# Patient Record
Sex: Male | Born: 1980
Health system: Southern US, Community
[De-identification: ages and names within clinical notes are randomized; demographics above are authoritative.]

## PROBLEM LIST (undated history)

## (undated) DIAGNOSIS — F419 Anxiety disorder, unspecified: Secondary | ICD-10-CM

## (undated) DIAGNOSIS — K900 Celiac disease: Secondary | ICD-10-CM

## (undated) DIAGNOSIS — E119 Type 2 diabetes mellitus without complications: Secondary | ICD-10-CM

## (undated) HISTORY — DX: Type 2 diabetes mellitus without complications: E11.9

## (undated) HISTORY — PX: APPENDECTOMY: SHX54

## (undated) HISTORY — PX: KNEE SURGERY: SHX244

## (undated) HISTORY — DX: Celiac disease: K90.0

## (undated) HISTORY — DX: Anxiety disorder, unspecified: F41.9

---

## 2001-09-23 ENCOUNTER — Emergency Department (HOSPITAL_COMMUNITY): Admission: EM | Admit: 2001-09-23 | Discharge: 2001-09-23 | Payer: Self-pay

## 2002-11-13 ENCOUNTER — Emergency Department (HOSPITAL_COMMUNITY): Admission: EM | Admit: 2002-11-13 | Discharge: 2002-11-13 | Payer: Self-pay | Admitting: Emergency Medicine

## 2005-10-11 ENCOUNTER — Encounter (INDEPENDENT_AMBULATORY_CARE_PROVIDER_SITE_OTHER): Payer: Self-pay | Admitting: Specialist

## 2005-10-11 ENCOUNTER — Observation Stay (HOSPITAL_COMMUNITY): Admission: EM | Admit: 2005-10-11 | Discharge: 2005-10-13 | Payer: Self-pay | Admitting: Emergency Medicine

## 2005-12-13 ENCOUNTER — Encounter: Admission: RE | Admit: 2005-12-13 | Discharge: 2005-12-13 | Payer: Self-pay | Admitting: General Surgery

## 2010-01-31 ENCOUNTER — Encounter: Admission: RE | Admit: 2010-01-31 | Discharge: 2010-01-31 | Payer: Self-pay | Admitting: Family Medicine

## 2010-02-09 ENCOUNTER — Encounter: Admission: RE | Admit: 2010-02-09 | Discharge: 2010-03-23 | Payer: Self-pay | Admitting: Family Medicine

## 2010-04-21 ENCOUNTER — Ambulatory Visit
Admission: RE | Admit: 2010-04-21 | Discharge: 2010-04-21 | Payer: Self-pay | Source: Home / Self Care | Attending: Orthopedic Surgery | Admitting: Orthopedic Surgery

## 2010-06-04 ENCOUNTER — Encounter: Payer: Self-pay | Admitting: Orthopedic Surgery

## 2010-07-26 LAB — POCT HEMOGLOBIN-HEMACUE: Hemoglobin: 15.3 g/dL (ref 13.0–17.0)

## 2010-08-07 ENCOUNTER — Emergency Department (HOSPITAL_BASED_OUTPATIENT_CLINIC_OR_DEPARTMENT_OTHER)
Admission: EM | Admit: 2010-08-07 | Discharge: 2010-08-07 | Disposition: A | Discharge status: Home / Self Care | Payer: PRIVATE HEALTH INSURANCE | Attending: Emergency Medicine | Admitting: Emergency Medicine

## 2010-08-07 DIAGNOSIS — F319 Bipolar disorder, unspecified: Secondary | ICD-10-CM | POA: Insufficient documentation

## 2010-08-07 DIAGNOSIS — B9789 Other viral agents as the cause of diseases classified elsewhere: Secondary | ICD-10-CM | POA: Insufficient documentation

## 2010-08-07 DIAGNOSIS — K589 Irritable bowel syndrome without diarrhea: Secondary | ICD-10-CM | POA: Insufficient documentation

## 2010-08-07 LAB — RAPID STREP SCREEN (MED CTR MEBANE ONLY): Streptococcus, Group A Screen (Direct): NEGATIVE

## 2010-08-07 LAB — MONONUCLEOSIS SCREEN: Mono Screen: NEGATIVE

## 2010-09-30 NOTE — Op Note (Signed)
NAMEAUDY, DAUPHINE NO.:  [id number]   MEDICAL RECORD NO.:  [id number]          PATIENT TYPE:  OBV   LOCATION:  2633                         FACILITY:  [facility]   PHYSICIAN:  Orson Ape. Weatherly, M.D.DATE OF BIRTH:  14-Apr-1981   DATE OF PROCEDURE:  10/11/2005  DATE OF DISCHARGE:                                 OPERATIVE REPORT   PREOPERATIVE DIAGNOSIS:  Acute appendicitis, retrocecal.   POSTOPERATIVE DIAGNOSIS:  Acute appendicitis, retrocecal, high lying cecum.   OPERATION:  Appendectomy.   SURGEON:  Orson Ape. Rise Patience, M.D.   ASSISTANT:  Nurse.   HISTORY:  Prudencio Velazco is a 30 year old male with about a 24 history of  progressive abdominal pain and nausea and vomiting. A CT was done at  [facility] which showed acute appendicitis located in the retrocecal area.  The patient was given 3 grams of Unasyn and permission obtained for an  appendectomy.  The patient positioned on the OR table, the abdomen was  prepped after induction of general anesthesia with Betadine solution, knee  flexed in a sterile manner. I made an incision kind of lateral to the  umbilicus on the right, sharp dissection down through the 2 inches of  adipose tissue and then kind of a muscle split doing the external oblique  and internal oblique, picking up the posterior transversalis and peritoneum.  I carefully opened into the peritoneal cavity and even though this incision  is much higher than the usual appendectomy, the cecum was still higher but I  could pack off the small bowel and could see the tip of the appendix. The  appendix was acutely inflamed and we could kind of bring the appendix down  kind of in the field.  The cecum itself was high enough that I could not  bring it up to skin level and I used a right-angle to clamp the appendiceal  mesentery, tied the proximal side but then the right angle came off as we  were tying the stay side and it was necessary to grasp the  area and then put  a free tie of  2-0 Vicryl on it. The appendiceal mesentery could then the  divided, mesentery tied with 2-0 Vicryl and the appendix I elected to staple  it with a TA-30 stapler since it is so deep in the wound. I then inverted  the little staple line with interrupted sutures of 3-0 silk and carefully  inspected the mesentery in the area and there was good hemostasis.  The  omentum was brought down over the cecum and then the wound was closed in  layers using an #0 Vicryl on the peritoneum and transversalis, interrupted  #0 Vicryl on the internal oblique fascia and then interrupted #0 Vicryl on  the external oblique.  The Scarpa's fascia was closed with 3-0 Vicryl, 4-0  Dexon subcuticular and Benzoin and Steri-Strips on the skin.  The patient  tolerated the procedure nicely and was taken to the recovery room in a  stable postop condition.           ______________________________  Orson Ape. Rise Patience, M.D.     WJW/MEDQ  D:  10/12/2005  T:  10/12/2005  Job:  [id number]   cc:   Dr. __________

## 2010-09-30 NOTE — H&P (Signed)
NAMECALIN, [name] NO.:  [id number]   MEDICAL RECORD NO.:  [id number]          PATIENT TYPE:  OBV   LOCATION:  7353                         FACILITY:  [facility]   PHYSICIAN:  Lonnie Barnes, Lonnie BarnesDATE OF BIRTH:  July 10, 1980   DATE OF ADMISSION:  10/11/2005  DATE OF DISCHARGE:                                HISTORY & PHYSICAL   CHIEF COMPLAINT:  Abdominal pain.   HISTORY OF PRESENT ILLNESS:  Lonnie Barnes is a 30 year old male who was  referred to ER from Urgent Care by Lonnie Barnes, M.D. where he presented  today with right abdominal pain of about 24-hour duration.  The patient had  a white count and urinalysis.  The white count was elevated at 16,400.  Urinalysis was negative and then he was sent to [facility] where he had a  CT without contrast that showed a very high appendix, retrocecal consistent  with acute appendicitis.  The patient was referred to the ER here, arriving  about 6 p.m.  On examination, he walked kind of protective on the right side  and was afebrile.  The patient states that he has had frequent bowel  movements and denies a problem with constipation.  He does have a history of  bipolar disorder and history of anxiety and on review of the CT it certainly  appears to me that he may have diverticulosis, but without the contrast in  the colon, it is difficult to be sure, but it certainly looks like a pattern  of chronic constipation and also diverticulosis.  The patient had an IV  started and 3 grams of Unasyn were obtained for appendectomy.   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  Denies any allergies and I do not think he has had any previous surgeries.  His parents arrived later and we will plan an open appendectomy.   PHYSICAL EXAMINATION:  VITAL SIGNS:  Temperature 98.7, blood pressure  137/82, pulse originally 93 and then to 77.  Respirations 18.  The patient  weighs 213 pounds.  HEENT:   Normocephalic.  Pupils equal, round, and reactive to light.  Appears  adequately hydrated.  LUNGS:  Clear.  HEART:  Normal sinus rhythm.  ABDOMEN:  He is bloated.  Tender to the right of the umbilicus and walks  kind of bent over as if he is having pretty significant abdominal pain, but  it appears localized.  SKIN:  Unremarkable.   IMPRESSION:  Acute appendicitis, probable retrocecal, but very high lying  appendix.           ______________________________  Lonnie Barnes, M.D.     Lonnie Barnes  D:  10/12/2005  T:  10/12/2005  Job:  [id number]

## 2011-02-21 IMAGING — CR DG KNEE 1-2V*R*
2 series · 2 of 2 positions shown · non-contrast
Comparison: None.

CLINICAL DATA: Right knee pain for 1 year, no trauma

RIGHT KNEE - 1-2 VIEW

[t knee ap right]
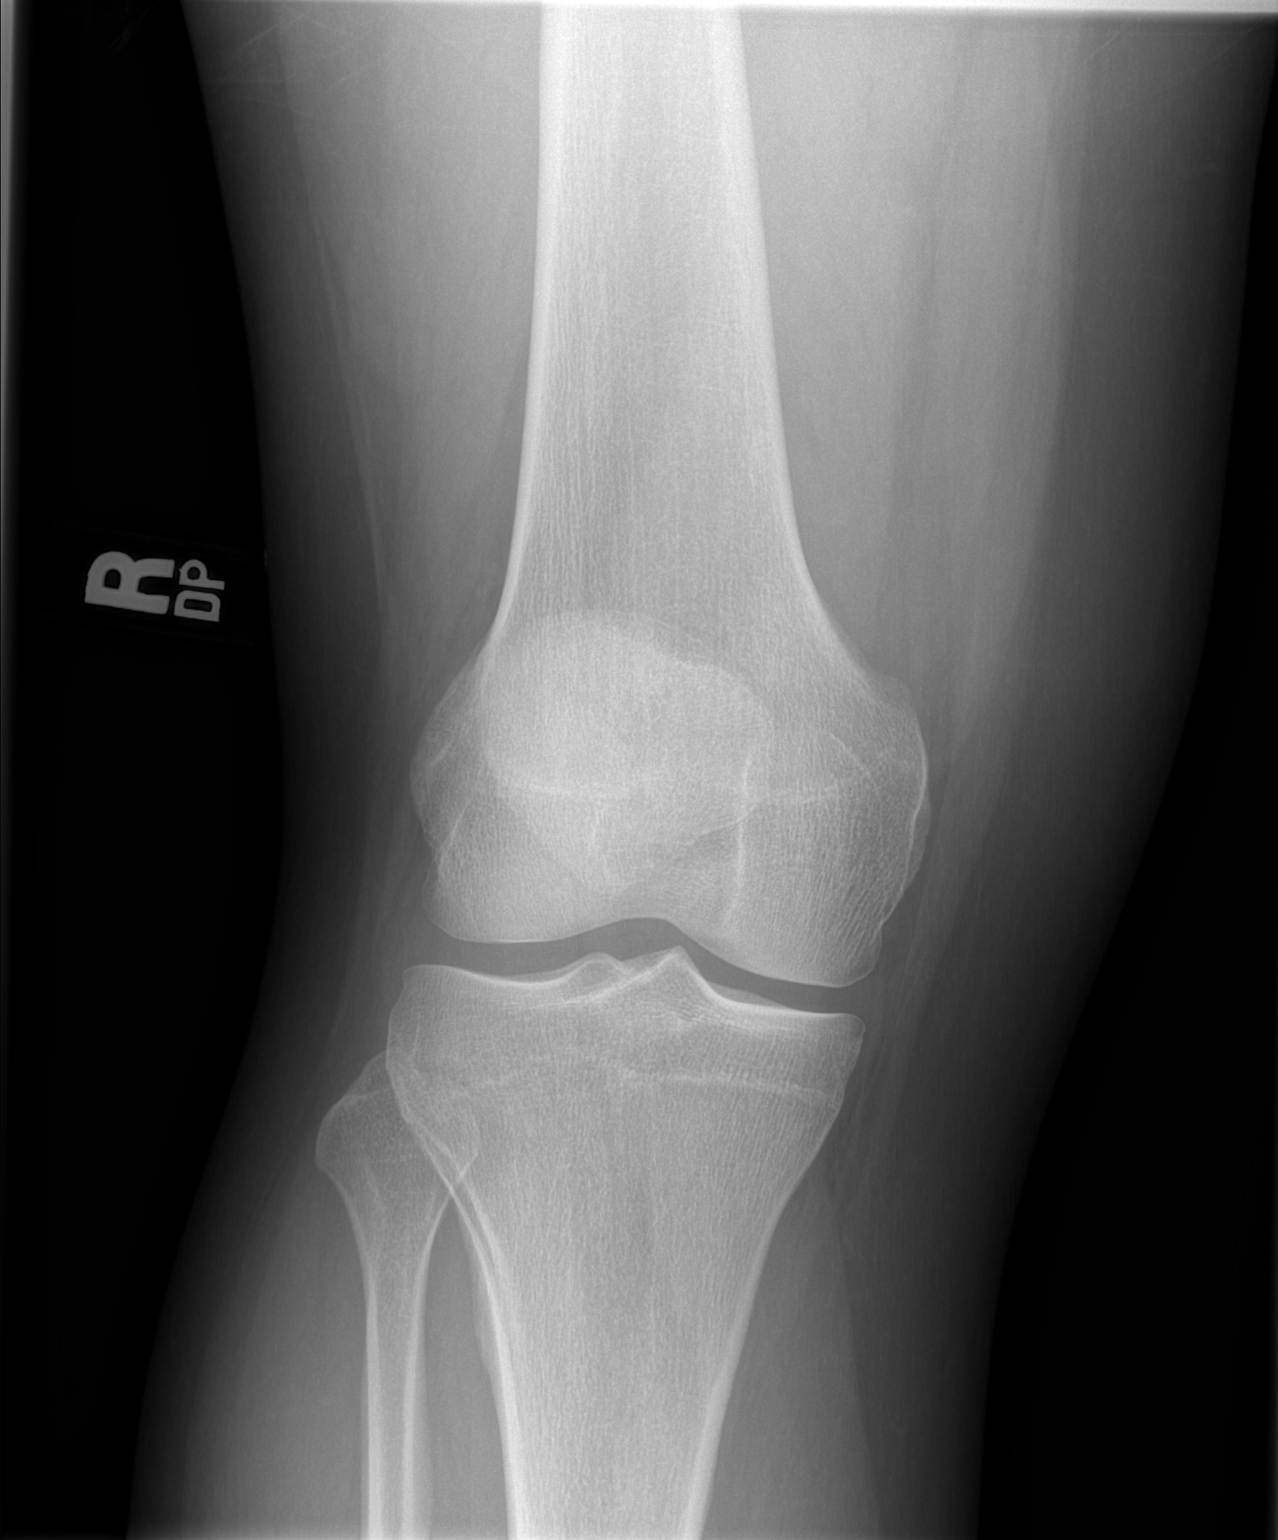

[t knee lat right]
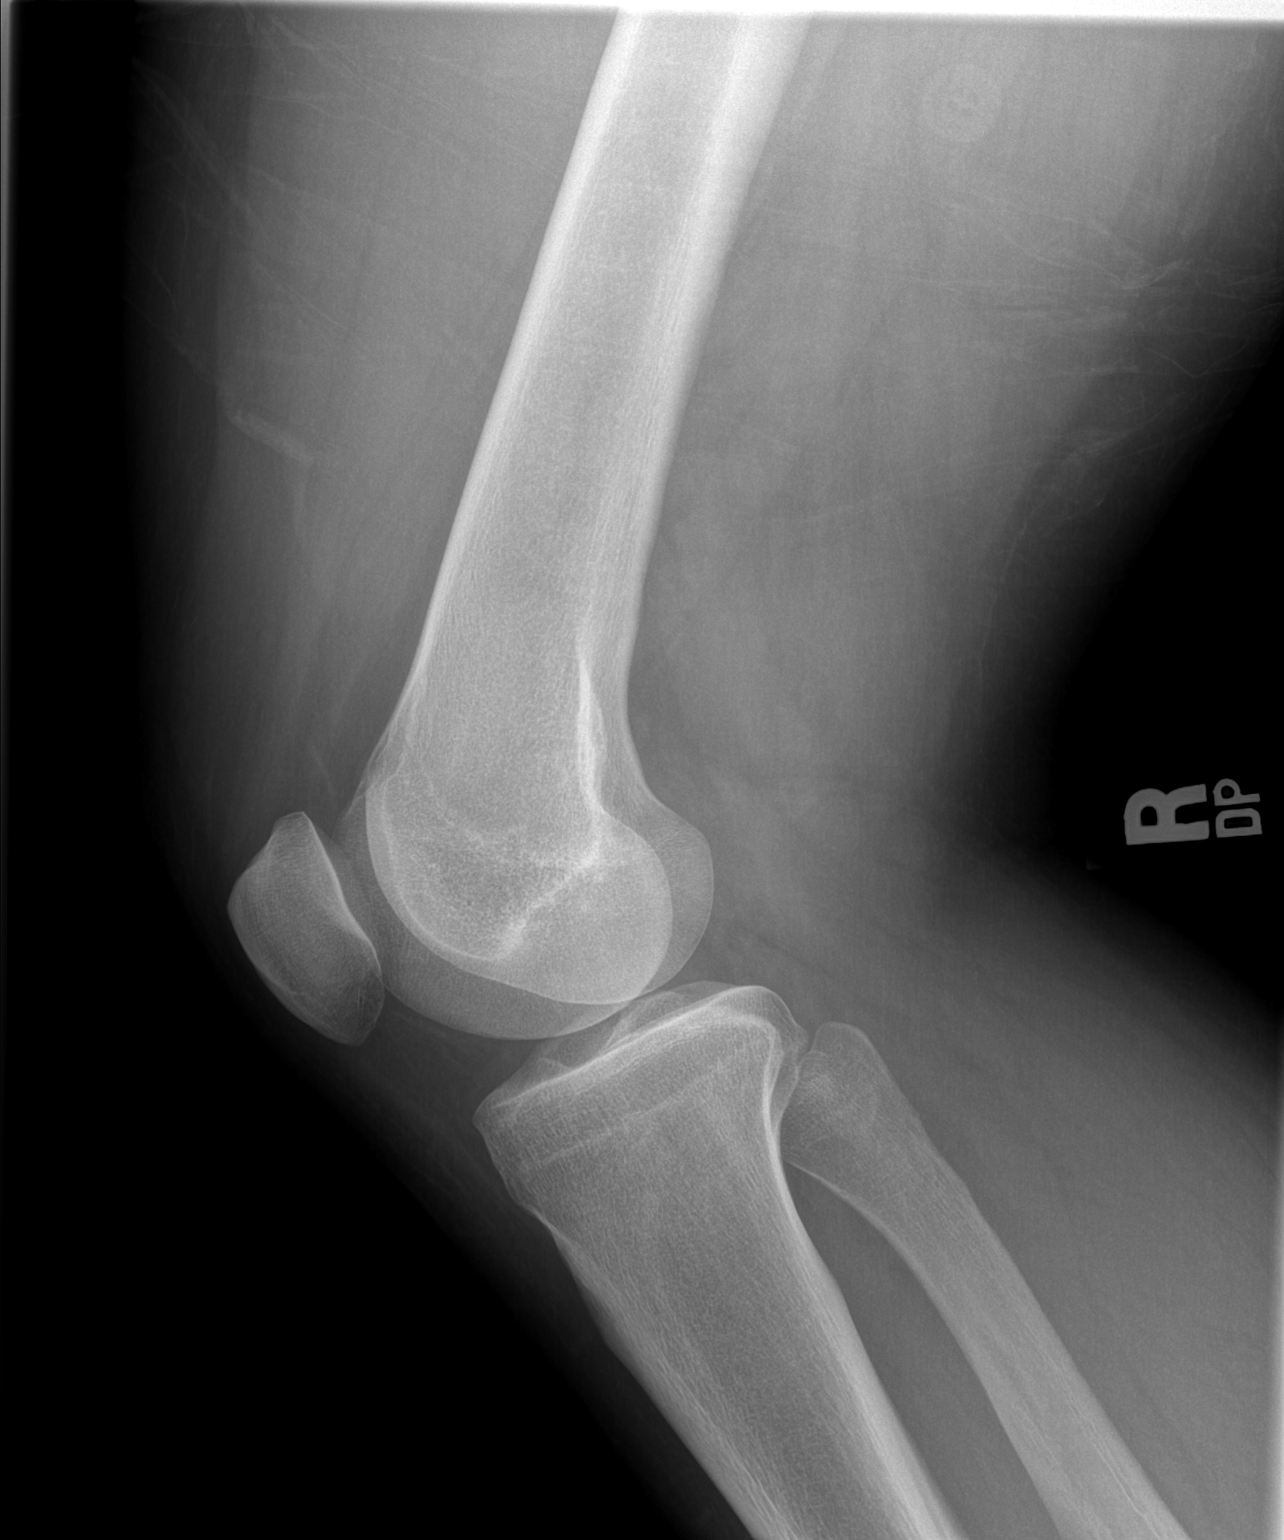

[2 of 2 positions shown; findings below may reference images not displayed]

FINDINGS: The right knee joint spaces appear relatively normal.  No
fracture is seen.  No effusion is noted.
IMPRESSION: Negative right knee.

## 2012-06-20 ENCOUNTER — Other Ambulatory Visit: Payer: Self-pay | Admitting: Family Medicine

## 2012-06-25 ENCOUNTER — Ambulatory Visit
Admission: RE | Admit: 2012-06-25 | Discharge: 2012-06-25 | Disposition: A | Discharge status: Home / Self Care | Payer: BC Managed Care – PPO | Source: Ambulatory Visit | Attending: Family Medicine | Admitting: Family Medicine

## 2013-07-16 IMAGING — US US ABDOMEN COMPLETE
1 series · 14 of 25 positions shown · non-contrast
Comparison: CT abdomen pelvis of 10/11/2005 from [REDACTED]

CLINICAL DATA: Elevated liver function tests

COMPLETE ABDOMINAL ULTRASOUND

[Series 1: us abdomen complete · 0.33mm/px · 14 of 75 slices shown]
[im 1/75]
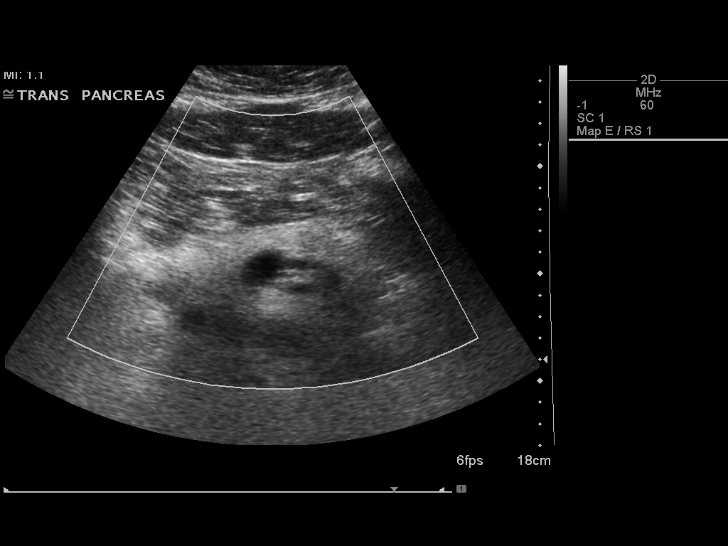
[im 7/75]
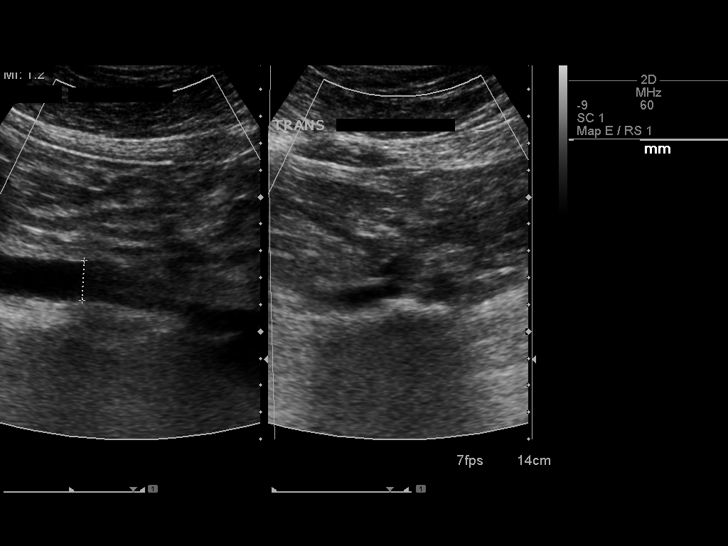
[im 13/75]
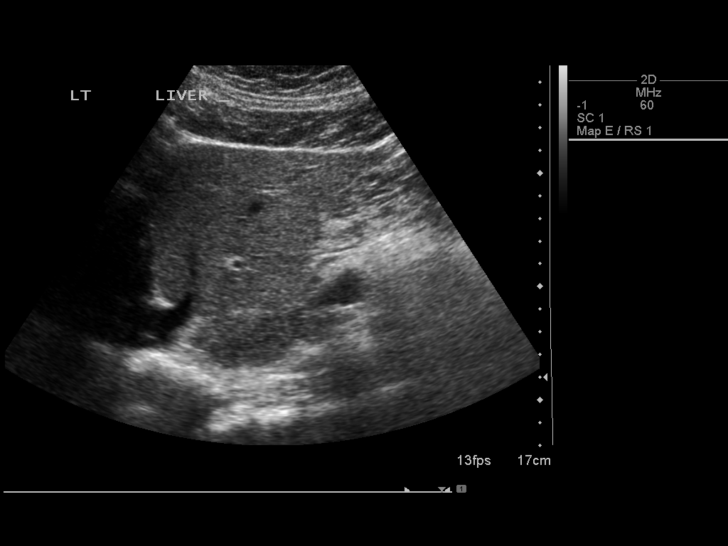
[im 19/75]
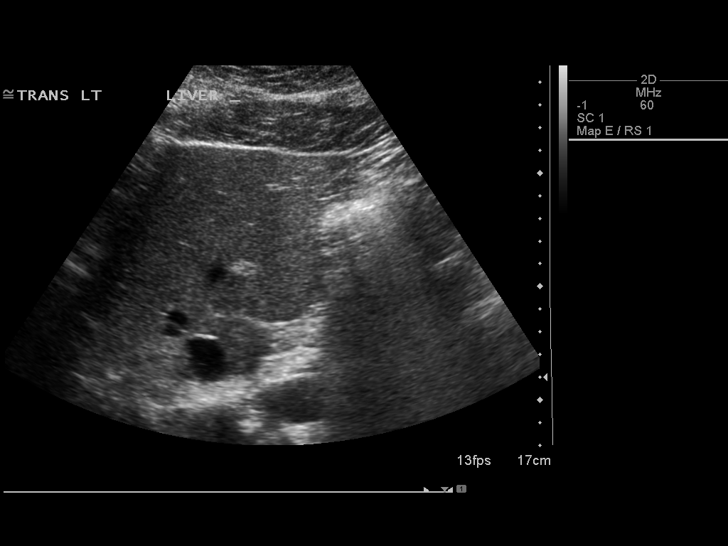
[im 25/75]
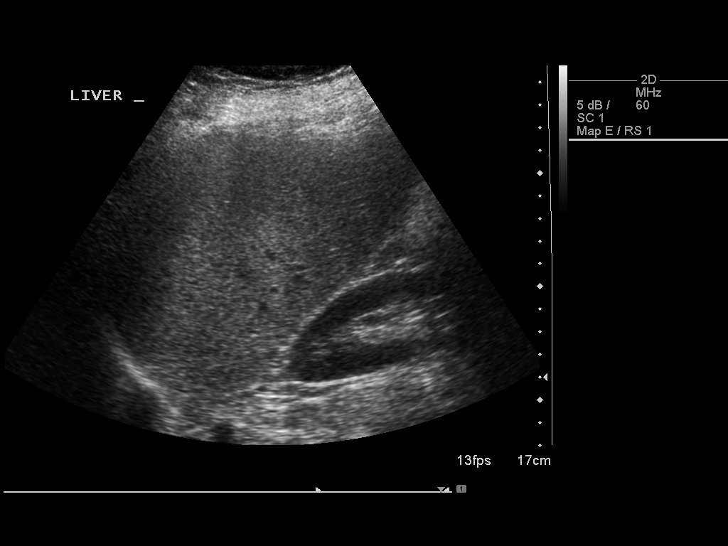
[im 28/75]
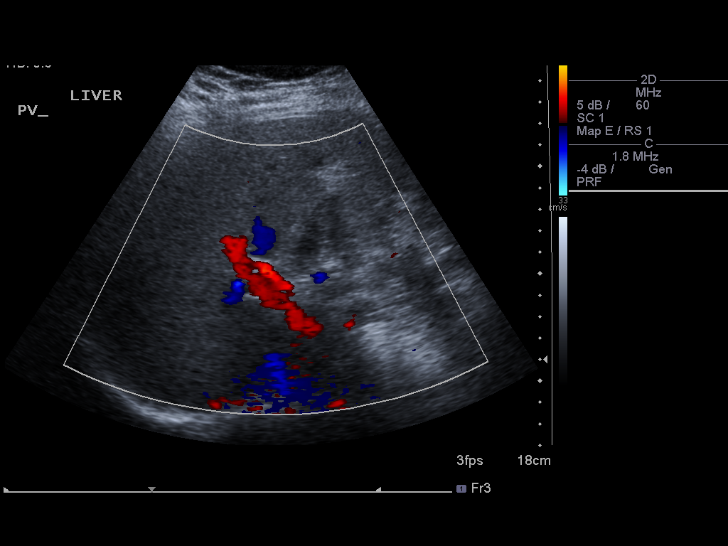
[im 34/75]
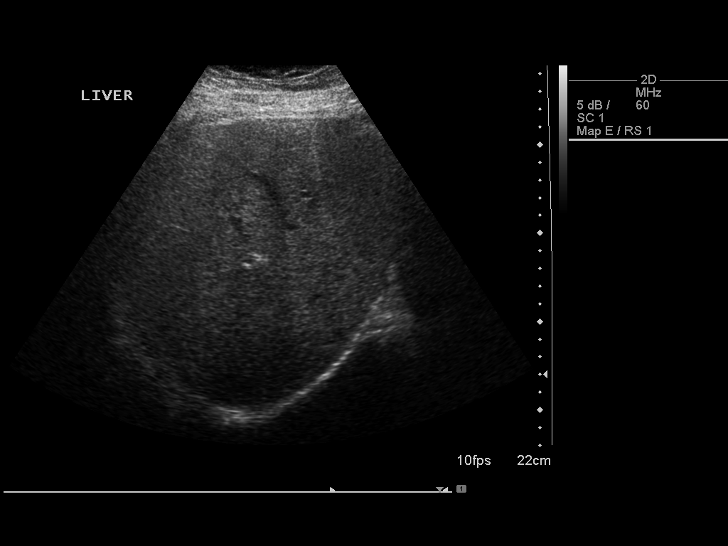
[im 41/75]
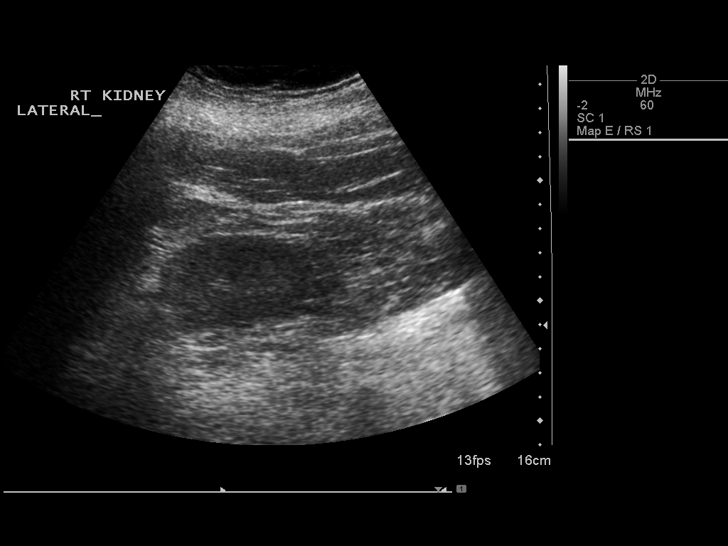
[im 47/75]
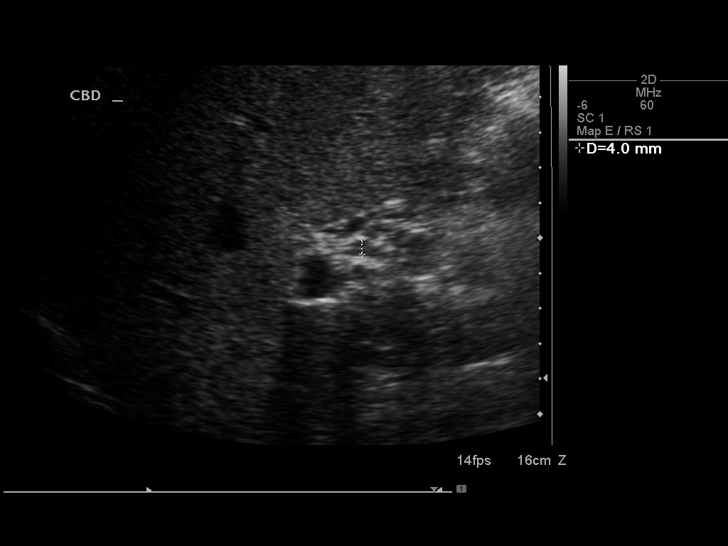
[im 50/75]
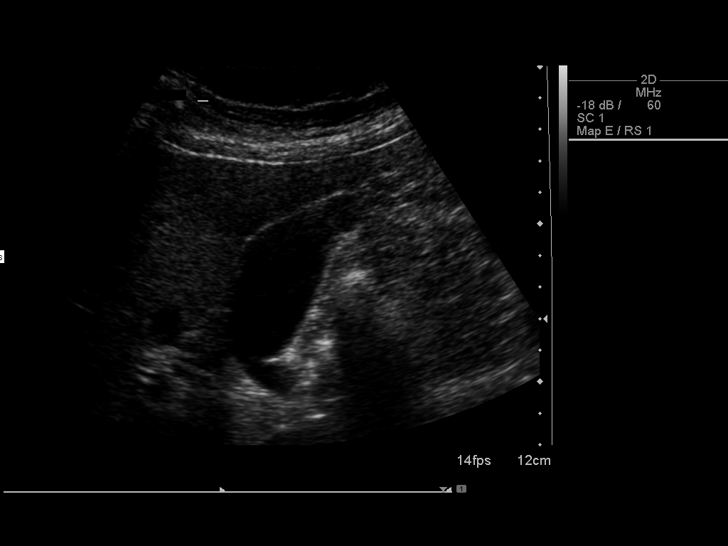
[im 56/75]
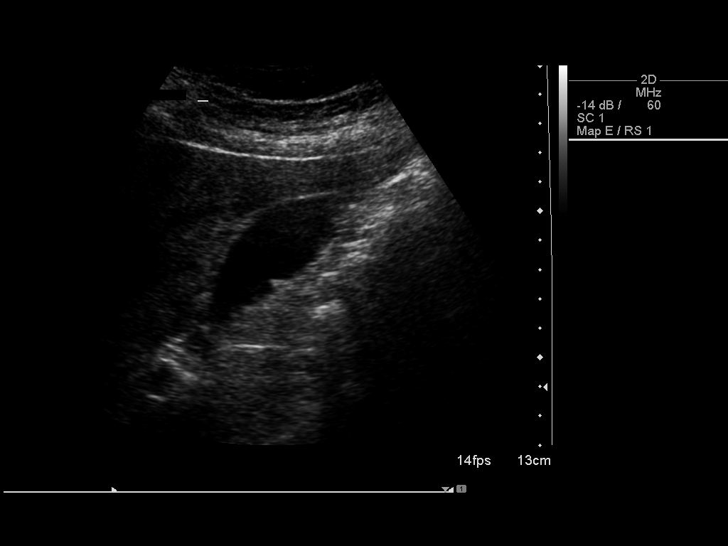
[im 62/75]
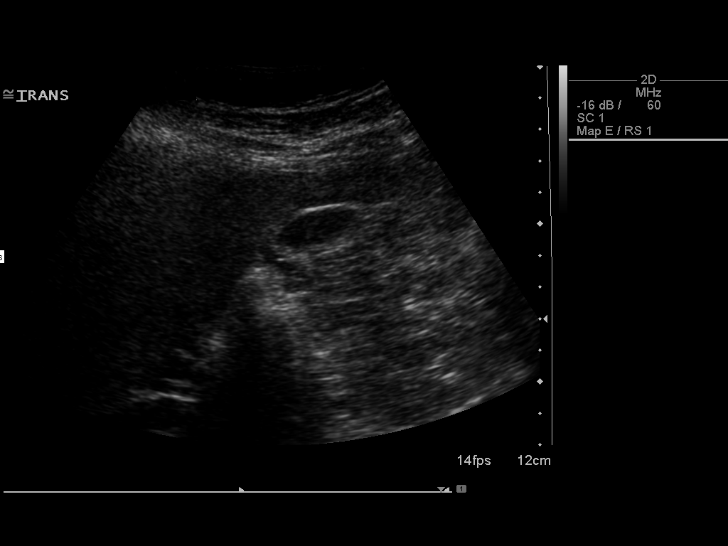
[im 68/75]
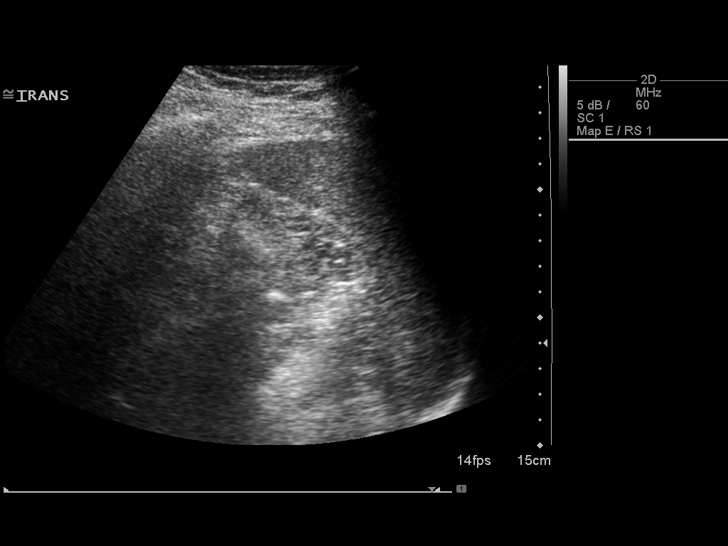
[im 75/75]
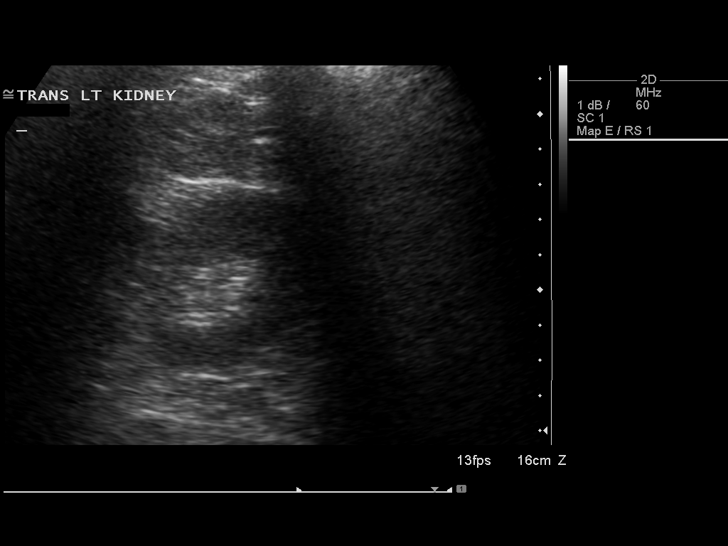

[14 of 25 positions shown; findings below may reference images not displayed]

FINDINGS: Gallbladder:  There is a small mobile echogenic focus within the
gallbladder consistent with a 4 mm gallstone.  No pain is present
over gallbladder with compression.

Common bile duct:  The common bile duct is normal measuring 4.0 mm
in diameter.

Liver:  The liver is diffusely echogenic consistent with fatty
infiltration.  No focal abnormality is seen.

IVC:  Appears normal.

Pancreas:  No focal abnormality seen.

Spleen:  The spleen is normal measuring 8.3 cm sagittally.

Right Kidney:  No hydronephrosis is seen.  The right kidney
measures 12.9 cm sagittally.

Left Kidney:  No hydronephrosis is noted.  The left kidney measures
11.8 cm.

Abdominal aorta:  The abdominal aorta is normal in caliber.
IMPRESSION: 1.  Single 4 mm gallstone.  No pain over the gallbladder.
2.  Echogenic liver consistent with fatty infiltration.  No ductal
dilatation.

## 2014-03-11 ENCOUNTER — Other Ambulatory Visit: Payer: Self-pay | Admitting: Dermatology

## 2014-04-28 ENCOUNTER — Other Ambulatory Visit: Payer: Self-pay | Admitting: Dermatology

## 2015-02-22 ENCOUNTER — Ambulatory Visit (HOSPITAL_COMMUNITY)
Admission: RE | Admit: 2015-02-22 | Discharge: 2015-02-22 | Disposition: A | Discharge status: Home / Self Care | Payer: BLUE CROSS/BLUE SHIELD | Source: Ambulatory Visit | Attending: Cardiology | Admitting: Cardiology

## 2015-02-22 ENCOUNTER — Other Ambulatory Visit (HOSPITAL_COMMUNITY): Payer: Self-pay | Admitting: Sports Medicine

## 2015-02-22 DIAGNOSIS — M7989 Other specified soft tissue disorders: Secondary | ICD-10-CM | POA: Diagnosis not present

## 2015-02-22 DIAGNOSIS — M79661 Pain in right lower leg: Secondary | ICD-10-CM | POA: Insufficient documentation

## 2016-09-04 ENCOUNTER — Encounter: Payer: Self-pay | Admitting: Family Medicine

## 2016-09-12 ENCOUNTER — Encounter: Payer: Self-pay | Admitting: Family Medicine

## 2016-09-21 ENCOUNTER — Encounter: Payer: Self-pay | Admitting: Family Medicine

## 2016-09-25 ENCOUNTER — Other Ambulatory Visit: Payer: Self-pay | Admitting: Pediatrics

## 2016-09-25 MED ORDER — EPINEPHRINE 0.15 MG/0.15ML IJ SOAJ: 0.1500 mg | INTRAMUSCULAR | 0 refills | Status: DC | PRN

## 2016-09-25 NOTE — Progress Notes (Signed)
Very allergic to yellow jackets, epipen ran out, has upcoming appt with Dr. Wendi Snipes.

## 2016-09-28 ENCOUNTER — Other Ambulatory Visit: Payer: Self-pay | Admitting: Pediatrics

## 2016-09-28 MED ORDER — EPINEPHRINE 0.3 MG/0.3ML IJ SOAJ: 0.3000 mg | Freq: Once | INTRAMUSCULAR | 0 refills | Status: AC

## 2016-12-07 ENCOUNTER — Ambulatory Visit (INDEPENDENT_AMBULATORY_CARE_PROVIDER_SITE_OTHER): Payer: 59 | Admitting: Family Medicine

## 2016-12-07 ENCOUNTER — Encounter: Payer: Self-pay | Admitting: Family Medicine

## 2016-12-07 VITALS — BP 140/90 | HR 74 | Temp 98.4°F | Ht 68.0 in | Wt 247.6 lb

## 2016-12-07 DIAGNOSIS — H6121 Impacted cerumen, right ear: Secondary | ICD-10-CM

## 2016-12-07 DIAGNOSIS — Z0000 Encounter for general adult medical examination without abnormal findings: Secondary | ICD-10-CM

## 2016-12-07 DIAGNOSIS — F419 Anxiety disorder, unspecified: Secondary | ICD-10-CM

## 2016-12-07 MED ORDER — GABAPENTIN 300 MG PO CAPS: 300.0000 mg | ORAL_CAPSULE | Freq: Three times a day (TID) | ORAL | 3 refills | Status: DC

## 2016-12-07 NOTE — Patient Instructions (Signed)
Great to meet you!  Come back in 1 month unless you need [place] ooner.   Your provider wants you to schedule an appointment with a Psychologist/Psychiatrist. The following list of offices requires the patient to call and make their own appointment, as there is information they need that only you can provide. Please feel free to choose form the following providers:  [facility] in [place]  [place]  [phone] [place], [place]  (Scheduled through [place]) Must call and do an interview for appointment. Sees Children / Accepts Medicaid  [facility] [place]  [address], [place]    [place], [place]  [phone] [place], [place] for Autism but does not treat it Sees Children / Accepts Medicaid  [facility]    [phone] [address], [place], [place] Medication management, substance abuse, bipolar, grief, family, marriage, OCD, anxiety, PTSD Sees children / Accepts Medicaid  [facility]    [phone] [address] [place], [place] children / Accepts [facility]  [facility]  [phone] [address] [place], [place]   Dr Lorenza Evangelist     [phone] [address] [place], [place]  Sees ADD & ADHD for treatment Accepts Medicaid  [facility]  [phone] [address] Arlean Hopping, [place] for Autism Accepts [facility]  [facility]  [phone] [place], [place]  Does Adult ADD evaluations Does not accept Medicaid  [facility]   [phone] [place], [place] children as young as 24 years old Accepts [facility]     [phone]    [place], [place] [address] Sees  children Accepts Medicaid

## 2016-12-07 NOTE — Progress Notes (Signed)
   HPI  Patient presents today here to establish care with anxiety and difficulty hearing.  Patient reports week history of difficulty hearing out of his right ear. No pain Has had impacted cerumen previously with similar symptoms.  Anxiety Patient has been treated for over 15 years with benzodiazepines for anxiety. He states that he has a hyper stimulation response to SSRIs. He has recently been trying to taper off of Xanax, he was previously taking 1/4 mg daily. Denies SI, denies history of bipolar disorder.  He does have extra xanax at home.   PMH:  Anxiety PSH: appe, R knee surgery PFH: Mom- COPD, Anxiety, epilepsy, father CAD Past socail Hx - current alcohol use- moderate, married ROS: Per HPI  Objective: BP 140/90   Pulse 74   Temp 98.4 F (36.9 C) (Oral)   Ht 5' 8"  (1.727 m)   Wt 247 lb 9.6 oz (112.3 kg)   BMI 37.65 kg/m  Gen: NAD, alert, cooperative with exam HEENT: NCAT, R tm obscured by cerumen, left TM within normal limits - After ear irrigation right TM is visible and appears macerated CV: RRR, good S1/S2, no murmur Resp: CTABL, no wheezes, non-labored Abd: SNTND, BS present, no guarding or organomegaly Ext: No edema, warm Neuro: Alert and oriented, No gross deficits  Assessment and plan:  # Anxiety Patient with long history of anxiety He cannot tolerate SSRIs Some concern for borderline personality disorder Trial of gabapentin Refer to psychiatry with unclear Dx  # cerumen impaction Cleaned out well today, debrox if needed, pt considering ENT which is reasonable if blocked again quickly.     Orders Placed This Encounter  Procedures  . Ambulatory referral to Psychiatry    Referral Priority:   Routine    Referral Type:   Psychiatric    Referral Reason:   Specialty Services Required    Requested Specialty:   Psychiatry    Number of Visits Requested:   1    Meds ordered this encounter  Medications  . gabapentin (NEURONTIN) 300 MG capsule   Sig: Take 1 capsule (300 mg total) by mouth 3 (three) times daily.    Dispense:  90 capsule    Refill:  [place], MD [place] 12/07/2016, 2:11 PM

## 2016-12-07 NOTE — Addendum Note (Signed)
Addended by: Timmothy Euler on: 12/07/2016 03:07 PM   Modules accepted: Level of Service

## 2016-12-08 LAB — CMP14+EGFR
ALBUMIN: 4.6 g/dL (ref 3.5–5.5)
ALK PHOS: 81 IU/L (ref 39–117)
ALT: 50 IU/L — ABNORMAL HIGH (ref 0–44)
AST: 32 IU/L (ref 0–40)
Albumin/Globulin Ratio: 1.5 (ref 1.2–2.2)
BUN/Creatinine Ratio: 9 (ref 9–20)
BUN: 8 mg/dL (ref 6–20)
Bilirubin Total: 1 mg/dL (ref 0.0–1.2)
CO2: 26 mmol/L (ref 20–29)
CREATININE: 0.91 mg/dL (ref 0.76–1.27)
Calcium: 9.4 mg/dL (ref 8.7–10.2)
Chloride: 100 mmol/L (ref 96–106)
GFR calc Af Amer: 125 mL/min/{1.73_m2} (ref >59)
GFR calc non Af Amer: 108 mL/min/{1.73_m2} (ref >59)
GLUCOSE: 85 mg/dL (ref 65–99)
Globulin, Total: 3 g/dL (ref 1.5–4.5)
Potassium: 4.5 mmol/L (ref 3.5–5.2)
Sodium: 141 mmol/L (ref 134–144)
Total Protein: 7.6 g/dL (ref 6.0–8.5)

## 2016-12-08 LAB — CBC WITH DIFFERENTIAL/PLATELET
BASOS ABS: 0 10*3/uL (ref 0.0–0.2)
Basos: 0 %
EOS (ABSOLUTE): 0.2 10*3/uL (ref 0.0–0.4)
EOS: 1 %
HEMATOCRIT: 51.6 % — AB (ref 37.5–51.0)
HEMOGLOBIN: 17.2 g/dL (ref 13.0–17.7)
Immature Grans (Abs): 0 10*3/uL (ref 0.0–0.1)
Immature Granulocytes: 0 %
Lymphocytes Absolute: 3.2 10*3/uL — ABNORMAL HIGH (ref 0.7–3.1)
Lymphs: 23 %
MCH: 30.3 pg (ref 26.6–33.0)
MCHC: 33.3 g/dL (ref 31.5–35.7)
MCV: 91 fL (ref 79–97)
MONOCYTES: 7 %
Monocytes Absolute: 1 10*3/uL — ABNORMAL HIGH (ref 0.1–0.9)
NEUTROS PCT: 69 %
Neutrophils Absolute: 9.2 10*3/uL — ABNORMAL HIGH (ref 1.4–7.0)
Platelets: 350 10*3/uL (ref 150–379)
RBC: 5.68 x10E6/uL (ref 4.14–5.80)
RDW: 14.3 % (ref 12.3–15.4)
WBC: 13.6 10*3/uL — AB (ref 3.4–10.8)

## 2016-12-08 LAB — LIPID PANEL
CHOL/HDL RATIO: 5.9 ratio — AB (ref 0.0–5.0)
CHOLESTEROL TOTAL: 205 mg/dL — AB (ref 100–199)
HDL: 35 mg/dL — AB (ref >39)
LDL CALC: 121 mg/dL — AB (ref 0–99)
TRIGLYCERIDES: 244 mg/dL — AB (ref 0–149)
VLDL Cholesterol Cal: 49 mg/dL — ABNORMAL HIGH (ref 5–40)

## 2016-12-08 LAB — TSH: TSH: 2.74 u[IU]/mL (ref 0.450–4.500)

## 2016-12-27 ENCOUNTER — Telehealth (HOSPITAL_COMMUNITY): Payer: Self-pay | Admitting: *Deleted

## 2016-12-27 NOTE — Telephone Encounter (Signed)
spoke with patient, he will call back tomorrow with his insurance information so his appointment will be scheduled.

## 2017-04-02 ENCOUNTER — Other Ambulatory Visit: Payer: Self-pay | Admitting: Physician Assistant

## 2017-04-02 MED ORDER — AMOXICILLIN 250 MG PO CAPS: 250.0000 mg | ORAL_CAPSULE | Freq: Three times a day (TID) | ORAL | 0 refills | Status: DC

## 2017-04-03 ENCOUNTER — Ambulatory Visit: Payer: Self-pay

## 2017-04-10 ENCOUNTER — Ambulatory Visit: Payer: 59 | Admitting: Family Medicine

## 2017-04-10 ENCOUNTER — Encounter: Payer: Self-pay | Admitting: Family Medicine

## 2017-04-10 VITALS — BP 125/80 | HR 78 | Temp 97.7°F | Ht 68.0 in | Wt 257.0 lb

## 2017-04-10 DIAGNOSIS — R52 Pain, unspecified: Secondary | ICD-10-CM

## 2017-04-10 DIAGNOSIS — J329 Chronic sinusitis, unspecified: Secondary | ICD-10-CM

## 2017-04-10 DIAGNOSIS — J40 Bronchitis, not specified as acute or chronic: Secondary | ICD-10-CM | POA: Diagnosis not present

## 2017-04-10 LAB — VERITOR FLU A/B WAIVED
INFLUENZA B: NEGATIVE
Influenza A: NEGATIVE

## 2017-04-10 MED ORDER — CEFUROXIME AXETIL 250 MG PO TABS: 250.0000 mg | ORAL_TABLET | Freq: Two times a day (BID) | ORAL | 0 refills | Status: DC

## 2017-04-10 NOTE — Progress Notes (Signed)
Chief Complaint  Patient presents with  . Generalized Body Aches    pt here today c/o body aches, fever and congestion. Pt is still on amoxicillin since last week for strep throat.    HPI  Patient presents today for Patient presents with upper respiratory congestion. Rhinorrhea that is frequently purulent. There is moderate sore throat. Patient reports coughing frequently as well.  yellow sputum noted. There is no fever, chills or sweats. The patient denies being short of breath. Onset was 3-5 days ago. Gradually worsening. Tried OTCs without improvement.  PMH: Smoking status noted ROS: Per HPI  Objective: BP 125/80   Pulse 78   Temp 97.7 F (36.5 C) (Oral)   Ht 5' 8"  (1.727 m)   Wt 257 lb (116.6 kg)   BMI 39.08 kg/m  Gen: NAD, alert, cooperative with exam HEENT: NCAT, Nasal passages swollen, red TMS RED CV: RRR, good S1/S2, no murmur Resp: Bronchitis changes with scattered wheezes, non-labored Ext: No edema, warm Neuro: Alert and oriented, No gross deficits  Assessment and plan:  1. Body aches   2. Sinobronchitis     Meds ordered this encounter  Medications  . cefUROXime (CEFTIN) 250 MG tablet    Sig: Take 1 tablet (250 mg total) by mouth 2 (two) times daily with a meal.    Dispense:  20 tablet    Refill:  0    Orders Placed This Encounter  Procedures  . Veritor Flu A/B Waived    Order Specific Question:   Source    Answer:   nasal    Follow up as needed.  Claretta Fraise, MD

## 2017-06-04 DIAGNOSIS — M25561 Pain in right knee: Secondary | ICD-10-CM | POA: Insufficient documentation

## 2017-07-24 DIAGNOSIS — M2241 Chondromalacia patellae, right knee: Secondary | ICD-10-CM | POA: Insufficient documentation

## 2017-10-16 ENCOUNTER — Encounter: Payer: Self-pay | Admitting: Pediatrics

## 2017-10-16 ENCOUNTER — Other Ambulatory Visit: Payer: Self-pay | Admitting: Pediatrics

## 2017-10-16 ENCOUNTER — Ambulatory Visit: Payer: 59 | Admitting: Pediatrics

## 2017-10-16 VITALS — BP 130/81 | HR 92 | Temp 98.0°F | Ht 68.0 in | Wt 257.0 lb

## 2017-10-16 DIAGNOSIS — J3089 Other allergic rhinitis: Secondary | ICD-10-CM | POA: Diagnosis not present

## 2017-10-16 MED ORDER — LEVOCETIRIZINE DIHYDROCHLORIDE 5 MG PO TABS: 5.0000 mg | ORAL_TABLET | Freq: Every evening | ORAL | 3 refills | Status: DC

## 2017-10-16 NOTE — Progress Notes (Signed)
  Subjective:   Patient ID: Lonnie Barnes, male    DOB: 1980-08-11, 37 y.o.   MRN: [id number] CC: Nasal Congestion  HPI: Lonnie Barnes is a 37 y.o. male   Using afrin spray daily for past 3-4 months. Congestion gets worse when he stops. Has heart palpitations with oral decongestants. Has been taking claritin. Was on allergy shots for environmental allergies when living in [place]., had some improvement in symptoms. Since moving here symptoms have worsened. Not using a nasal steroid spray.  Relevant past medical, surgical, family and social history reviewed. Allergies and medications reviewed and updated. Social History   Tobacco Use  Smoking Status Never Smoker  Smokeless Tobacco Never Used   ROS: Per HPI   Objective:    BP 130/81   Pulse 92   Temp 98 F (36.7 C) (Oral)   Ht 5' 8"  (1.727 m)   Wt 257 lb (116.6 kg) Comment: Refused  BMI 39.08 kg/m   Wt Readings from Last 3 Encounters:  10/16/17 257 lb (116.6 kg)  04/10/17 257 lb (116.6 kg)  12/07/16 247 lb 9.6 oz (112.3 kg)    Gen: NAD, alert, cooperative with exam, NCAT EYES: EOMI, no conjunctival injection, or no icterus ENT:  TMs pearly gray b/l, OP without erythema LYMPH: no cervical LAD CV: NRRR, normal S1/S2, no murmur, distal pulses 2+ b/l Resp: CTABL, no wheezes, normal WOB Abd: +BS, soft, NTND. no guarding or organomegaly Ext: No edema, warm Neuro: Alert and oriented, strength equal b/l UE and LE, coordination grossly normal MSK: normal muscle bulk  Assessment & Plan:  Lonnie Barnes was seen today for nasal congestion.  Diagnoses and all orders for this visit:  Allergic rhinitis due to other allergic trigger, unspecified seasonality Nasal congestion Stop afrin, will have some rebound congestion. Can try flonase nasal spray, xyal 70m daily. Planning to follow up with allergy if not improving.  Follow up plan: Return if symptoms worsen or fail to improve. CAssunta Found MD [place]

## 2017-11-07 ENCOUNTER — Other Ambulatory Visit: Payer: Self-pay | Admitting: Pediatrics

## 2017-11-07 DIAGNOSIS — J320 Chronic maxillary sinusitis: Secondary | ICD-10-CM

## 2017-11-07 MED ORDER — AMOXICILLIN-POT CLAVULANATE 875-125 MG PO TABS: 1.0000 | ORAL_TABLET | Freq: Two times a day (BID) | ORAL | 0 refills | Status: DC

## 2017-11-07 NOTE — Progress Notes (Signed)
Here today with a sick child.  He has had fevers up to 101, 102 for the last 6 days.  Last temperature was yesterday.  He has been taking Tylenol and ibuprofen regularly.  Coughing a lot.  Lots of pain in his sinuses, not any better today compared to yesterday.  Multiple other family members sick.  PE:  ENT: TMs dull bilaterally, tender to palpation over sinuses.  Respiratory: Normal work of breathing, lungs clear to auscultation bilaterally.  We will treat for sinusitis with Augmentin.  He has no allergies to antibiotics.  Symptomatic care discussed.

## 2018-02-21 DIAGNOSIS — Z23 Encounter for immunization: Secondary | ICD-10-CM | POA: Diagnosis not present

## 2018-02-21 DIAGNOSIS — E785 Hyperlipidemia, unspecified: Secondary | ICD-10-CM | POA: Diagnosis not present

## 2018-02-21 DIAGNOSIS — Z0000 Encounter for general adult medical examination without abnormal findings: Secondary | ICD-10-CM | POA: Diagnosis not present

## 2018-02-22 DIAGNOSIS — R7309 Other abnormal glucose: Secondary | ICD-10-CM | POA: Diagnosis not present

## 2018-02-22 DIAGNOSIS — E785 Hyperlipidemia, unspecified: Secondary | ICD-10-CM | POA: Diagnosis not present

## 2018-02-22 DIAGNOSIS — R635 Abnormal weight gain: Secondary | ICD-10-CM | POA: Diagnosis not present

## 2018-02-22 DIAGNOSIS — M2550 Pain in unspecified joint: Secondary | ICD-10-CM | POA: Diagnosis not present

## 2018-02-22 DIAGNOSIS — M9904 Segmental and somatic dysfunction of sacral region: Secondary | ICD-10-CM | POA: Diagnosis not present

## 2018-02-22 DIAGNOSIS — M9903 Segmental and somatic dysfunction of lumbar region: Secondary | ICD-10-CM | POA: Diagnosis not present

## 2018-02-22 DIAGNOSIS — M5386 Other specified dorsopathies, lumbar region: Secondary | ICD-10-CM | POA: Diagnosis not present

## 2018-02-22 DIAGNOSIS — R1013 Epigastric pain: Secondary | ICD-10-CM | POA: Diagnosis not present

## 2018-02-27 DIAGNOSIS — M9903 Segmental and somatic dysfunction of lumbar region: Secondary | ICD-10-CM | POA: Diagnosis not present

## 2018-02-27 DIAGNOSIS — M9904 Segmental and somatic dysfunction of sacral region: Secondary | ICD-10-CM | POA: Diagnosis not present

## 2018-02-27 DIAGNOSIS — M5386 Other specified dorsopathies, lumbar region: Secondary | ICD-10-CM | POA: Diagnosis not present

## 2018-02-28 ENCOUNTER — Encounter: Payer: 59 | Attending: Family Medicine | Admitting: Dietician

## 2018-02-28 ENCOUNTER — Encounter: Payer: Self-pay | Admitting: Dietician

## 2018-02-28 DIAGNOSIS — E119 Type 2 diabetes mellitus without complications: Secondary | ICD-10-CM

## 2018-02-28 DIAGNOSIS — K900 Celiac disease: Secondary | ICD-10-CM

## 2018-02-28 NOTE — Patient Instructions (Addendum)
Call to get strips and needles for your meter (One Touch Verio Flex) Rethink what you drink.  Avoid regular soda and juice. Bake rather than fry and avoid high fat items.  Limit saturated fat.  Choose olive oil instead of butter. Stay active.  Aim for 30 minutes most days.  Engine2diet.com- Rip Esselstyn ([employer]) Nutrition Facts.org [email or web address] (Dr. Alyssa Grove)

## 2018-02-28 NOTE — Progress Notes (Signed)
Diabetes Self-Management Education  Visit Type: First/Initial  Appt. Start Time: 1030 Appt. End Time: 1210  02/28/2018  Mr. Lonnie Barnes, identified by name and date of birth, is a 37 y.o. male with a diagnosis of Diabetes: Type 2.  History also includes probable celiac disease, anxiety, HLD, and etoh abuse.  He currently drinks 2-6 drinks per night and states that he plans on quitting.  He has significant sleep problems and thinks that he needs a sleep study.  He is also going to undergo some genetic testing.  He has plantar fascitis and has had knee surgery in the past which make some exercise difficult.  When time he will ride bike, row or swim without significant problems.  Patient had been avoiding wehat, dairy, soy, and MSG for the past 9 years but has been back on wheat since July due testing for Celiac disease.  Celiac labs were high and patient needs to get an endoscopy to confirm the diagnosis.  He also was recently diagnosed with type 2 diabetes.  He was a vegetarian for 7 years in the past.  He does not follow a kosher diet at home due to children with increased sensory problems. Labs include:  02/22/18:  IgA98, IgatTG26 (both elevated), Cholesterol 255, HDL 37, LDL 186, Glucose 151, A1C 7.2%. Medications include Prednisone for sinus issues.  No medication currently for diabetes. One Touch Verio Flex (Lot [id number]) provided and instructed today.  His blood sugar was 254.  Reviewed gluten free eating with Celiac with patient. Discussed cross contamination.  Patient verbalized being very strict in the past and had no questions.  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  Patient does much of the cooking.  His wife does most of the shopping. 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   ASSESSMENT  Height 5' 8"  (1.727 m), weight 259 lb (117.5 kg). Body mass index is 39.38 kg/m.  Diabetes Self-Management Education - 02/28/18 1102      Visit Information   Visit Type  First/Initial      Initial Visit   Diabetes Type  Type 2    Are you currently following a meal plan?  No    Are you taking your medications as prescribed?  Not on Medications    Date Diagnosed  10/19      Health Coping   How would you rate your overall health?  Fair      Psychosocial Assessment   Patient Belief/Attitude about Diabetes  Motivated to manage diabetes    Self-care barriers  None    Self-management support  Doctor's office;Family    Other persons present  Patient    Patient Concerns  Nutrition/Meal planning    Special Needs  None    Preferred Learning Style  No preference indicated    Learning Readiness  Ready    How often do you need to have someone help you when you read instructions, pamphlets, or other written materials from your doctor or pharmacy?  1 - Never    What is the last grade level you completed in school?  Master's degree      Pre-Education Assessment   Patient understands the diabetes disease and treatment process.  Needs Instruction    Patient understands incorporating nutritional management into lifestyle.  Needs Instruction  Patient undertands incorporating physical activity into lifestyle.  Needs Instruction    Patient understands using medications safely.  Needs Instruction    Patient understands monitoring blood glucose, interpreting and using results  Needs Instruction    Patient understands prevention, detection, and treatment of acute complications.  Needs Instruction    Patient understands prevention, detection, and treatment of chronic complications.  Needs Instruction    Patient understands how to develop strategies to address psychosocial issues.  Needs Instruction    Patient understands how to develop strategies to promote health/change  behavior.  Needs Instruction      Complications   Last HgB A1C per patient/outside source  7.2 %   10/11/019   How often do you check your blood sugar?  0 times/day (not testing)    Have you had a dilated eye exam in the past 12 months?  No    Have you had a dental exam in the past 12 months?  Yes    Are you checking your feet?  Yes    How many days per week are you checking your feet?  7      Dietary Intake   Breakfast  skips OR egg and cheese biscuit    Lunch  Skips if he eats breakfast OR leftovers OR out to eat ([place] or Asian)    Snack (afternoon)  popsickles or simular to evening    Dinner  often eating out OR steak, lamb or chicken, rice, vegetables    Snack (evening)  popsickles or chips or cheese or meat    Beverage(s)  "a lot of juice", 1 regular soda per day, less than 1 cup water per day (likes cucumber water or ice water when out)      Exercise   Exercise Type  Light (walking / raking leaves)      Patient Education   Previous Diabetes Education  No    Disease state   Definition of diabetes, type 1 and 2, and the diagnosis of diabetes;Factors that contribute to the development of diabetes;Explored patient's options for treatment of their diabetes    Nutrition management   Role of diet in the treatment of diabetes and the relationship between the three main macronutrients and blood glucose level;Meal options for control of blood glucose level and chronic complications.;Other (comment)   celiac   Physical activity and exercise   Role of exercise on diabetes management, blood pressure control and cardiac health.;Helped patient identify appropriate exercises in relation to his/her diabetes, diabetes complications and other health issue.    Medications  Reviewed patients medication for diabetes, action, purpose, timing of dose and side effects.    Monitoring  Purpose and frequency of SMBG.;Taught/evaluated SMBG meter.;Identified appropriate SMBG and/or A1C goals.;Daily foot  exams;Yearly dilated eye exam    Chronic complications  Relationship between chronic complications and blood glucose control    Psychosocial adjustment  Role of stress on diabetes      Individualized Goals (developed by patient)   Nutrition  General guidelines for healthy choices and portions discussed    Physical Activity  Exercise 5-7 days per week;30 minutes per day    Medications  Not Applicable    Monitoring   test my blood glucose as discussed    Problem Solving  sleep, improved nutrition, exercise, stress control    Reducing Risk  examine blood glucose patterns;do foot checks daily;increase portions of healthy fats    Health Coping  discuss diabetes with (comment)   MD, RD, CDE  Post-Education Assessment   Patient understands the diabetes disease and treatment process.  Demonstrates understanding / competency    Patient understands incorporating nutritional management into lifestyle.  Demonstrates understanding / competency    Patient undertands incorporating physical activity into lifestyle.  Demonstrates understanding / competency    Patient understands using medications safely.  Demonstrates understanding / competency    Patient understands monitoring blood glucose, interpreting and using results  Demonstrates understanding / competency    Patient understands prevention, detection, and treatment of acute complications.  Demonstrates understanding / competency    Patient understands prevention, detection, and treatment of chronic complications.  Demonstrates understanding / competency    Patient understands how to develop strategies to address psychosocial issues.  Demonstrates understanding / competency    Patient understands how to develop strategies to promote health/change behavior.  Demonstrates understanding / competency      Outcomes   Expected Outcomes  Demonstrated interest in learning. Expect positive outcomes    Future DMSE  3-4 months    Program Status  Completed        Individualized Plan for Diabetes Self-Management Training:   Learning Objective:  Patient will have a greater understanding of diabetes self-management. Patient education plan is to attend individual and/or group sessions per assessed needs and concerns.   Plan:   Patient Instructions  Call to get strips and needles for your meter (One Touch Verio Flex) Rethink what you drink.  Avoid regular soda and juice. Bake rather than fry and avoid high fat items.  Limit saturated fat.  Choose olive oil instead of butter. Stay active.  Aim for 30 minutes most days.  Engine[employer]diet[employer]com- Rip Esselstyn ([employer]) Nutrition Facts.org Pcrm.org (Dr. Alyssa Grove)   Expected Outcomes:  Demonstrated interest in learning. Expect positive outcomes  Education material provided: A1C conversion sheet, Meal plan card, My Plate and Snack sheet , living well with diabetes, celiac label reading from AND, celiac tips from AND, Celiac nutrition therapy from AND If problems or questions, patient to contact team via:  Phone  Future DSME appointment: 3-4 months

## 2018-03-01 DIAGNOSIS — R894 Abnormal immunological findings in specimens from other organs, systems and tissues: Secondary | ICD-10-CM | POA: Diagnosis not present

## 2018-03-04 DIAGNOSIS — R768 Other specified abnormal immunological findings in serum: Secondary | ICD-10-CM | POA: Diagnosis not present

## 2018-03-04 DIAGNOSIS — K2930 Chronic superficial gastritis without bleeding: Secondary | ICD-10-CM | POA: Diagnosis not present

## 2018-03-04 DIAGNOSIS — R1084 Generalized abdominal pain: Secondary | ICD-10-CM | POA: Diagnosis not present

## 2018-03-04 DIAGNOSIS — K2990 Gastroduodenitis, unspecified, without bleeding: Secondary | ICD-10-CM | POA: Diagnosis not present

## 2018-03-07 DIAGNOSIS — M9903 Segmental and somatic dysfunction of lumbar region: Secondary | ICD-10-CM | POA: Diagnosis not present

## 2018-03-07 DIAGNOSIS — M9904 Segmental and somatic dysfunction of sacral region: Secondary | ICD-10-CM | POA: Diagnosis not present

## 2018-03-07 DIAGNOSIS — M5386 Other specified dorsopathies, lumbar region: Secondary | ICD-10-CM | POA: Diagnosis not present

## 2018-03-13 DIAGNOSIS — M9903 Segmental and somatic dysfunction of lumbar region: Secondary | ICD-10-CM | POA: Diagnosis not present

## 2018-03-13 DIAGNOSIS — M9904 Segmental and somatic dysfunction of sacral region: Secondary | ICD-10-CM | POA: Diagnosis not present

## 2018-03-13 DIAGNOSIS — M5386 Other specified dorsopathies, lumbar region: Secondary | ICD-10-CM | POA: Diagnosis not present

## 2018-03-15 DIAGNOSIS — K900 Celiac disease: Secondary | ICD-10-CM | POA: Diagnosis not present

## 2018-03-15 DIAGNOSIS — M5386 Other specified dorsopathies, lumbar region: Secondary | ICD-10-CM | POA: Diagnosis not present

## 2018-03-15 DIAGNOSIS — J310 Chronic rhinitis: Secondary | ICD-10-CM | POA: Diagnosis not present

## 2018-03-15 DIAGNOSIS — M9904 Segmental and somatic dysfunction of sacral region: Secondary | ICD-10-CM | POA: Diagnosis not present

## 2018-03-15 DIAGNOSIS — M9903 Segmental and somatic dysfunction of lumbar region: Secondary | ICD-10-CM | POA: Diagnosis not present

## 2018-03-15 DIAGNOSIS — E119 Type 2 diabetes mellitus without complications: Secondary | ICD-10-CM | POA: Diagnosis not present

## 2018-03-24 ENCOUNTER — Other Ambulatory Visit: Payer: Self-pay | Admitting: Pediatrics

## 2018-03-24 DIAGNOSIS — J3089 Other allergic rhinitis: Secondary | ICD-10-CM

## 2018-03-25 DIAGNOSIS — K900 Celiac disease: Secondary | ICD-10-CM | POA: Diagnosis not present

## 2018-03-26 DIAGNOSIS — M9903 Segmental and somatic dysfunction of lumbar region: Secondary | ICD-10-CM | POA: Diagnosis not present

## 2018-03-26 DIAGNOSIS — M5386 Other specified dorsopathies, lumbar region: Secondary | ICD-10-CM | POA: Diagnosis not present

## 2018-03-26 DIAGNOSIS — M9904 Segmental and somatic dysfunction of sacral region: Secondary | ICD-10-CM | POA: Diagnosis not present

## 2018-04-29 DIAGNOSIS — G4733 Obstructive sleep apnea (adult) (pediatric): Secondary | ICD-10-CM | POA: Diagnosis not present

## 2018-05-10 DIAGNOSIS — G4733 Obstructive sleep apnea (adult) (pediatric): Secondary | ICD-10-CM | POA: Diagnosis not present

## 2018-05-17 DIAGNOSIS — E7800 Pure hypercholesterolemia, unspecified: Secondary | ICD-10-CM | POA: Diagnosis not present

## 2018-05-17 DIAGNOSIS — K900 Celiac disease: Secondary | ICD-10-CM | POA: Diagnosis not present

## 2018-05-17 DIAGNOSIS — E119 Type 2 diabetes mellitus without complications: Secondary | ICD-10-CM | POA: Diagnosis not present

## 2018-06-10 DIAGNOSIS — G4733 Obstructive sleep apnea (adult) (pediatric): Secondary | ICD-10-CM | POA: Diagnosis not present

## 2018-07-11 DIAGNOSIS — M545 Low back pain: Secondary | ICD-10-CM | POA: Diagnosis not present

## 2018-07-11 DIAGNOSIS — G4733 Obstructive sleep apnea (adult) (pediatric): Secondary | ICD-10-CM | POA: Diagnosis not present

## 2018-07-11 DIAGNOSIS — J069 Acute upper respiratory infection, unspecified: Secondary | ICD-10-CM | POA: Diagnosis not present

## 2018-08-06 DIAGNOSIS — G4733 Obstructive sleep apnea (adult) (pediatric): Secondary | ICD-10-CM | POA: Diagnosis not present

## 2018-08-07 DIAGNOSIS — J310 Chronic rhinitis: Secondary | ICD-10-CM | POA: Diagnosis not present

## 2018-08-07 DIAGNOSIS — T485X5A Adverse effect of other anti-common-cold drugs, initial encounter: Secondary | ICD-10-CM | POA: Diagnosis not present

## 2018-08-07 DIAGNOSIS — G4733 Obstructive sleep apnea (adult) (pediatric): Secondary | ICD-10-CM | POA: Diagnosis not present

## 2018-08-09 DIAGNOSIS — G4733 Obstructive sleep apnea (adult) (pediatric): Secondary | ICD-10-CM | POA: Diagnosis not present

## 2019-12-09 ENCOUNTER — Encounter: Payer: Self-pay | Admitting: Allergy and Immunology

## 2019-12-09 ENCOUNTER — Ambulatory Visit (INDEPENDENT_AMBULATORY_CARE_PROVIDER_SITE_OTHER): Payer: 59 | Admitting: Allergy and Immunology

## 2019-12-09 ENCOUNTER — Other Ambulatory Visit: Payer: Self-pay

## 2019-12-09 VITALS — BP 110/90 | HR 80 | Resp 16 | Ht 68.0 in | Wt 220.0 lb

## 2019-12-09 DIAGNOSIS — D751 Secondary polycythemia: Secondary | ICD-10-CM | POA: Diagnosis not present

## 2019-12-09 DIAGNOSIS — J3089 Other allergic rhinitis: Secondary | ICD-10-CM

## 2019-12-09 DIAGNOSIS — J310 Chronic rhinitis: Secondary | ICD-10-CM | POA: Diagnosis not present

## 2019-12-09 DIAGNOSIS — T485X5A Adverse effect of other anti-common-cold drugs, initial encounter: Secondary | ICD-10-CM | POA: Diagnosis not present

## 2019-12-09 MED ORDER — AZELASTINE HCL 0.1 % NA SOLN: NASAL | 5 refills | Status: AC

## 2019-12-09 NOTE — Progress Notes (Signed)
[facility] [place]   NEW PATIENT NOTE  Referring Provider: No ref. provider found Primary Provider: Lujean Amel, MD Date of office visit: 12/09/2019    Subjective:   Chief Complaint:  Lonnie Barnes (DOB: 1980/05/24) is a 39 y.o. male who presents to the clinic on 12/09/2019 with a chief complaint of Nasal Congestion .  HPI: Lonnie Barnes presents to this clinic in evaluation of breathing problems.  He has a long history of allergic rhinoconjunctivitis occurring on a perennial basis with flare during the spring and fall especially following exposure to pollens and dust and cats.  He underwent a course of immunotherapy during middle school for 2 years which helped him significantly but then he relapsed after discontinuing this form of therapy.  He utilizes a collection of nasal steroids and antihistamines which do not result in good control of this issue especially during the spring and fall.  He has been using Afrin since February 2021 on a consistent basis for nasal congestion.  He has a history of sleep apnea presently treated with a mandibular advancement device and he needs to use Afrin to keep his nose open so that he can breathe adequately at nighttime.  He used Afrin every night and occasionally he will use it midday.  He does not have any anosmia or significant headache history or history of ugly nasal discharge or sinusitis.  He has received 2 Covid vaccinations.  Past Medical History:  Diagnosis Date  . Anxiety   . Celiac disease   . Diabetes mellitus without complication [facility])     Past Surgical History:  Procedure Laterality Date  . APPENDECTOMY    . KNEE SURGERY Right     Allergies as of 12/09/2019      Reactions   Pseudoephedrine Other (See Comments)   Hyperactive   Gluten Meal    Other    SSRI-causes skin crawling   Soy Allergy       Medication List      cetirizine 10 MG tablet Commonly known as: ZYRTEC Take  10 mg by mouth daily.   fluticasone 50 MCG/ACT nasal spray Commonly known as: FLONASE Place into both nostrils daily.   levocetirizine 5 MG tablet Commonly known as: XYZAL TAKE 1 TABLET BY MOUTH EVERY DAY IN THE EVENING   oxymetazoline 0.05 % nasal spray Commonly known as: AFRIN Place 1 spray into both nostrils 2 (two) times daily.       Review of systems negative except as noted in HPI / PMHx or noted below:  Review of Systems  Constitutional: Negative.   HENT: Negative.   Eyes: Negative.   Respiratory: Negative.   Cardiovascular: Negative.   Gastrointestinal: Negative.   Genitourinary: Negative.   Musculoskeletal: Negative.   Skin: Negative.   Neurological: Negative.   Endo/Heme/Allergies: Negative.   Psychiatric/Behavioral: Negative.     Family History  Problem Relation Age of Onset  . Anxiety disorder Mother   . COPD Mother   . Epilepsy Mother   . Seizures Mother   . Heart disease Father     Social History   Socioeconomic History  . Marital status: Married    Spouse name: Not on file  . Number of children: Not on file  . Years of education: Not on file  . Highest education level: Not on file  Occupational History  . Not on file  Tobacco Use  . Smoking status: Never Smoker  . Smokeless tobacco: Never Used  Vaping Use  . Vaping Use: Never used  Substance and Sexual Activity  . Alcohol use: Yes    Comment: 7/26- 4-5 days a week with 1-3 beers per day  . Drug use: No  . Sexual activity: Not on file  Other Topics Concern  . Not on file  Social History Narrative  . Not on file    Environmental and Social history  Lives in a house with a dry environment, dog located inside the household, no carpet in the bedroom, no plastic on the bed, no plastic on the pillow, no smoking ongoing with inside the household.  Objective:   Vitals:   12/09/19 1505  BP: (!) 110/90  Pulse: 80  Resp: 16  SpO2: 97%   Height: 5' 8"  (172.7 cm) Weight: (!) 220 lb  (99.8 kg)  Physical Exam Constitutional:      Appearance: He is not diaphoretic.  HENT:     Head: Normocephalic.     Right Ear: Tympanic membrane, ear canal and external ear normal.     Left Ear: Tympanic membrane, ear canal and external ear normal.     Nose: Nose normal. No mucosal edema or rhinorrhea.     Mouth/Throat:     Pharynx: Uvula midline. No oropharyngeal exudate.  Eyes:     Conjunctiva/sclera: Conjunctivae normal.  Neck:     Thyroid: No thyromegaly.     Trachea: Trachea normal. No tracheal tenderness or tracheal deviation.  Cardiovascular:     Rate and Rhythm: Normal rate and regular rhythm.     Heart sounds: Normal heart sounds, S1 normal and S2 normal. No murmur heard.   Pulmonary:     Effort: No respiratory distress.     Breath sounds: Normal breath sounds. No stridor. No wheezing or rales.  Lymphadenopathy:     Head:     Right side of head: No tonsillar adenopathy.     Left side of head: No tonsillar adenopathy.     Cervical: No cervical adenopathy.  Skin:    Findings: No erythema or rash.     Nails: There is no clubbing.  Neurological:     Mental Status: He is alert.     Diagnostics: Allergy skin tests were not performed.   Results of blood tests obtained 07 December 2016 identified hemoglobin 17.2, WBC 13.6, absolute lymphocyte 3200, absolute eosinophil 200, platelet 350  Assessment and Plan:    1. Polycythemia   2. Rhinitis medicamentosa   3. Perennial allergic rhinitis     1. Dust mite avoidance measures  2. Every night, use the following:   A. Afrin - 1 spray single nostril. Alternate nostril nightly  B. Flonase - 1 spray each nostril  C. Azelastine - 1 spray each nostril  3. Every morning, use the following:   A. Flonase - 1 spray each nostril  B. Azelastine - 1 spray each nostril  C. Xyzal 5 mg - 1 tablet  4. If needed:   A. Nasal saline  B. OTC Pataday - 1 drop each eye 1 time per day  5. Immunotherapy?  6. Obtain fall flu  vaccine  7. Blood - CBC w/D  8. Return to clinic in 4 weeks or earlier if problem  Lonnie Barnes appears to have rhinitis medicamentosa complicating his allergic rhinoconjunctivitis and will try him on the plan noted above in the hope of alleviating his rebound phenomena while he still continues to utilize a nasal decongestant spray.  I will see him back in this clinic  in 4 weeks to consider further evaluation and treatment based as were on his response to this approach. I suspect that at that point in time he will not require his low-dose nasal decongestant spray. As well, he does have documented polycythemia and will recheck a CBC with differential in investigation of possible polycythemia contributing to his nasal congestion.  Allena Katz, MD Allergy / Immunology [place]

## 2019-12-09 NOTE — Patient Instructions (Addendum)
  1. Dust mite avoidance measures  2. Every night, use the following:   A. Afrin - 1 spray single nostril. Alternate nostril nightly  B. Flonase - 1 spray each nostril  C. Azelastine - 1 spray each nostril  3. Every morning, use the following:   A. Flonase - 1 spray each nostril  B. Azelastine - 1 spray each nostril  C. Xyzal 5 mg - 1 tablet  4. If needed:   A. Nasal saline  B. OTC Pataday - 1 drop each eye 1 time per day  5. Immunotherapy?  6. Obtain fall flu vaccine  7. Blood - CBC w/D  8. Return to clinic in 4 weeks or earlier if problem

## 2019-12-10 ENCOUNTER — Encounter: Payer: Self-pay | Admitting: Allergy and Immunology

## 2019-12-10 LAB — CBC WITH DIFFERENTIAL
Basophils Absolute: 0 10*3/uL (ref 0.0–0.2)
Basos: 0 %
EOS (ABSOLUTE): 0.4 10*3/uL (ref 0.0–0.4)
Eos: 4 %
Hematocrit: 44.3 % (ref 37.5–51.0)
Hemoglobin: 14.5 g/dL (ref 13.0–17.7)
Immature Grans (Abs): 0 10*3/uL (ref 0.0–0.1)
Immature Granulocytes: 0 %
Lymphocytes Absolute: 2.8 10*3/uL (ref 0.7–3.1)
Lymphs: 30 %
MCH: 28.2 pg (ref 26.6–33.0)
MCHC: 32.7 g/dL (ref 31.5–35.7)
MCV: 86 fL (ref 79–97)
Monocytes Absolute: 0.8 10*3/uL (ref 0.1–0.9)
Monocytes: 9 %
Neutrophils Absolute: 5.2 10*3/uL (ref 1.4–7.0)
Neutrophils: 57 %
RBC: 5.14 x10E6/uL (ref 4.14–5.80)
RDW: 11.9 % (ref 11.6–15.4)
WBC: 9.2 10*3/uL (ref 3.4–10.8)

## 2020-01-06 ENCOUNTER — Encounter: Payer: Self-pay | Admitting: Allergy and Immunology

## 2020-01-06 ENCOUNTER — Ambulatory Visit: Payer: 59 | Admitting: Allergy and Immunology

## 2020-01-06 ENCOUNTER — Other Ambulatory Visit: Payer: Self-pay

## 2020-01-06 VITALS — BP 110/82 | HR 78 | Resp 18 | Ht 68.0 in

## 2020-01-06 DIAGNOSIS — J310 Chronic rhinitis: Secondary | ICD-10-CM | POA: Diagnosis not present

## 2020-01-06 DIAGNOSIS — J3089 Other allergic rhinitis: Secondary | ICD-10-CM

## 2020-01-06 DIAGNOSIS — D751 Secondary polycythemia: Secondary | ICD-10-CM

## 2020-01-06 DIAGNOSIS — T485X5D Adverse effect of other anti-common-cold drugs, subsequent encounter: Secondary | ICD-10-CM

## 2020-01-06 NOTE — Patient Instructions (Signed)
  1. Every night, use the following:   A. Flonase - 1 spray each nostril  B. Azelastine - 1 spray each nostril  2. Every morning, use the following:   A. Flonase - 1 spray each nostril  B. Azelastine - 1 spray each nostril  3. If needed:   A. Nasal saline  B. OTC Pataday - 1 drop each eye 1 time per day  C. OTC Zyrtec 10 mg - 1 tablet 1 time per day  4. Immunotherapy?  5. Obtain fall flu vaccine and Covid booster vaccine  6. Return to clinic in 1 year or earlier if problem

## 2020-01-06 NOTE — Progress Notes (Signed)
Lonnie Barnes   Follow-up Note  Referring Provider: Lujean Amel, MD Primary Provider: Lujean Amel, MD Date of Office Visit: 01/06/2020  Subjective:   Lonnie Barnes (DOB: 05-29-1980) is a 39 y.o. male who returns to the [facility] on 01/06/2020 in re-evaluation of the following:  HPI: Lonnie Barnes returns to this clinic in evaluation of allergic rhinoconjunctivitis and rhinitis medicamentosa and a history of polycythemia.  His last visit to this clinic was his initial evaluation of 09 December 2019.  He is doing 100% better and basically has no respiratory tract symptoms at all and has been able to taper off his Afrin while he continues to use a combination of a nasal steroid and nasal antihistamine.  Allergies as of 01/06/2020      Reactions   Pseudoephedrine Other (See Comments)   Hyperactive   Gluten Meal    Other    SSRI-causes skin crawling   Soy Allergy       Medication List      azelastine 0.1 % nasal spray Commonly known as: ASTELIN Use 1 spray in each nostril twice Lonnie Barnes as directed   cetirizine 10 MG tablet Commonly known as: ZYRTEC Take 10 mg by mouth Lonnie Barnes.   cyanocobalamin 100 MCG tablet Take by mouth.   fluticasone 50 MCG/ACT nasal spray Commonly known as: FLONASE Place into both nostrils Lonnie Barnes.   levocetirizine 5 MG tablet Commonly known as: XYZAL TAKE 1 TABLET BY MOUTH EVERY DAY IN THE EVENING   oxymetazoline 0.05 % nasal spray Commonly known as: AFRIN Place 1 spray into both nostrils 2 (two) times Lonnie Barnes.       Past Medical History:  Diagnosis Date  . Anxiety   . Celiac disease   . Diabetes mellitus without complication [facility])     Past Surgical History:  Procedure Laterality Date  . APPENDECTOMY    . KNEE SURGERY Right     Review of systems negative except as noted in HPI / PMHx or noted below:  Review of Systems  Constitutional: Negative.   HENT: Negative.   Eyes:  Negative.   Respiratory: Negative.   Cardiovascular: Negative.   Gastrointestinal: Negative.   Genitourinary: Negative.   Musculoskeletal: Negative.   Skin: Negative.   Neurological: Negative.   Endo/Heme/Allergies: Negative.   Psychiatric/Behavioral: Negative.      Objective:   Vitals:   01/06/20 0843  BP: 110/82  Pulse: 78  Resp: 18  SpO2: 97%   Height: 5' 8"  (172.7 cm)      Physical Exam Constitutional:      Appearance: He is not diaphoretic.  HENT:     Head: Normocephalic.     Right Ear: Tympanic membrane, ear canal and external ear normal.     Left Ear: Tympanic membrane, ear canal and external ear normal.     Nose: Nose normal. No mucosal edema or rhinorrhea.     Mouth/Throat:     Pharynx: Uvula midline. No oropharyngeal exudate.  Eyes:     Conjunctiva/sclera: Conjunctivae normal.  Neck:     Thyroid: No thyromegaly.     Trachea: Trachea normal. No tracheal tenderness or tracheal deviation.  Cardiovascular:     Rate and Rhythm: Normal rate and regular rhythm.     Heart sounds: Normal heart sounds, S1 normal and S2 normal. No murmur heard.   Pulmonary:     Effort: No respiratory distress.     Breath sounds: Normal breath sounds. No stridor.  No wheezing or rales.  Lymphadenopathy:     Head:     Right side of head: No tonsillar adenopathy.     Left side of head: No tonsillar adenopathy.     Cervical: No cervical adenopathy.  Skin:    Findings: No erythema or rash.     Nails: There is no clubbing.  Neurological:     Mental Status: He is alert.     Diagnostics:     Results of blood tests obtained 09 December 2019 identified hemoglobin 14.5.  Assessment and Plan:   1. Perennial allergic rhinitis   2. Rhinitis medicamentosa   3. Polycythemia     1. Every night, use the following:   A. Flonase - 1 spray each nostril  B. Azelastine - 1 spray each nostril  2. Every morning, use the following:   A. Flonase - 1 spray each nostril  B. Azelastine - 1  spray each nostril  3. If needed:   A. Nasal saline  B. OTC Pataday - 1 drop each eye 1 time per day  C. OTC Zyrtec 10 mg - 1 tablet 1 time per day  4. Immunotherapy?  5. Obtain fall flu vaccine and Covid booster vaccine  6. Return to clinic in 1 year or earlier if problem  Lonnie Barnes is really doing very well and I had a discussion with him today about varying his dose of nasal steroid and nasal antihistamine to the lowest dose required to control his disease state.  It does appear as though he has resolved his rhinitis medicamentosa and his polycythemia.  We will now see him back in his clinic in 1 year or earlier if there is a problem.  Should he fail medical therapy he would be a candidate for immunotherapy.  Allena Katz, MD Allergy / Immunology [place]

## 2020-01-07 ENCOUNTER — Encounter: Payer: Self-pay | Admitting: Allergy and Immunology

## 2023-06-19 DIAGNOSIS — M25511 Pain in right shoulder: Secondary | ICD-10-CM | POA: Diagnosis not present

## 2023-06-19 DIAGNOSIS — E119 Type 2 diabetes mellitus without complications: Secondary | ICD-10-CM | POA: Diagnosis not present

## 2023-07-10 DIAGNOSIS — M25511 Pain in right shoulder: Secondary | ICD-10-CM | POA: Diagnosis not present

## 2023-07-25 DIAGNOSIS — M25511 Pain in right shoulder: Secondary | ICD-10-CM | POA: Diagnosis not present

## 2023-08-03 DIAGNOSIS — M25511 Pain in right shoulder: Secondary | ICD-10-CM | POA: Diagnosis not present

## 2023-08-22 DIAGNOSIS — M7551 Bursitis of right shoulder: Secondary | ICD-10-CM | POA: Diagnosis not present

## 2023-08-22 DIAGNOSIS — M24111 Other articular cartilage disorders, right shoulder: Secondary | ICD-10-CM | POA: Diagnosis not present

## 2023-08-22 DIAGNOSIS — M25811 Other specified joint disorders, right shoulder: Secondary | ICD-10-CM | POA: Diagnosis not present

## 2023-08-22 DIAGNOSIS — M7521 Bicipital tendinitis, right shoulder: Secondary | ICD-10-CM | POA: Diagnosis not present

## 2023-08-22 DIAGNOSIS — S43431A Superior glenoid labrum lesion of right shoulder, initial encounter: Secondary | ICD-10-CM | POA: Diagnosis not present

## 2023-08-22 DIAGNOSIS — Y999 Unspecified external cause status: Secondary | ICD-10-CM | POA: Diagnosis not present

## 2023-08-22 DIAGNOSIS — M19011 Primary osteoarthritis, right shoulder: Secondary | ICD-10-CM | POA: Diagnosis not present

## 2023-08-22 DIAGNOSIS — X58XXXA Exposure to other specified factors, initial encounter: Secondary | ICD-10-CM | POA: Diagnosis not present

## 2023-08-22 DIAGNOSIS — M7581 Other shoulder lesions, right shoulder: Secondary | ICD-10-CM | POA: Diagnosis not present

## 2023-08-22 DIAGNOSIS — G8918 Other acute postprocedural pain: Secondary | ICD-10-CM | POA: Diagnosis not present

## 2023-08-27 DIAGNOSIS — S43431D Superior glenoid labrum lesion of right shoulder, subsequent encounter: Secondary | ICD-10-CM | POA: Diagnosis not present

## 2023-08-28 DIAGNOSIS — M24111 Other articular cartilage disorders, right shoulder: Secondary | ICD-10-CM | POA: Diagnosis not present

## 2023-08-30 DIAGNOSIS — S43431D Superior glenoid labrum lesion of right shoulder, subsequent encounter: Secondary | ICD-10-CM | POA: Diagnosis not present

## 2023-09-11 DIAGNOSIS — S43431D Superior glenoid labrum lesion of right shoulder, subsequent encounter: Secondary | ICD-10-CM | POA: Diagnosis not present

## 2023-09-18 DIAGNOSIS — M7582 Other shoulder lesions, left shoulder: Secondary | ICD-10-CM | POA: Diagnosis not present

## 2023-09-19 DIAGNOSIS — S43431D Superior glenoid labrum lesion of right shoulder, subsequent encounter: Secondary | ICD-10-CM | POA: Diagnosis not present

## 2023-09-26 DIAGNOSIS — S43431D Superior glenoid labrum lesion of right shoulder, subsequent encounter: Secondary | ICD-10-CM | POA: Diagnosis not present

## 2023-09-28 DIAGNOSIS — S43431D Superior glenoid labrum lesion of right shoulder, subsequent encounter: Secondary | ICD-10-CM | POA: Diagnosis not present

## 2023-10-05 DIAGNOSIS — S43431D Superior glenoid labrum lesion of right shoulder, subsequent encounter: Secondary | ICD-10-CM | POA: Diagnosis not present

## 2023-10-09 DIAGNOSIS — S43431D Superior glenoid labrum lesion of right shoulder, subsequent encounter: Secondary | ICD-10-CM | POA: Diagnosis not present

## 2023-10-10 DIAGNOSIS — E119 Type 2 diabetes mellitus without complications: Secondary | ICD-10-CM | POA: Diagnosis not present

## 2023-10-10 DIAGNOSIS — F1020 Alcohol dependence, uncomplicated: Secondary | ICD-10-CM | POA: Diagnosis not present

## 2023-10-10 DIAGNOSIS — Z0000 Encounter for general adult medical examination without abnormal findings: Secondary | ICD-10-CM | POA: Diagnosis not present

## 2023-11-13 DIAGNOSIS — S43431D Superior glenoid labrum lesion of right shoulder, subsequent encounter: Secondary | ICD-10-CM | POA: Diagnosis not present

## 2023-11-23 DIAGNOSIS — S43431D Superior glenoid labrum lesion of right shoulder, subsequent encounter: Secondary | ICD-10-CM | POA: Diagnosis not present

## 2024-03-25 DIAGNOSIS — J22 Unspecified acute lower respiratory infection: Secondary | ICD-10-CM | POA: Diagnosis not present

## 2024-04-01 DIAGNOSIS — J189 Pneumonia, unspecified organism: Secondary | ICD-10-CM | POA: Diagnosis not present

## 2024-04-08 DIAGNOSIS — E119 Type 2 diabetes mellitus without complications: Secondary | ICD-10-CM | POA: Diagnosis not present

## 2024-04-08 DIAGNOSIS — J209 Acute bronchitis, unspecified: Secondary | ICD-10-CM | POA: Diagnosis not present
